# Patient Record
Sex: Male | Born: 1965 | Race: Black or African American | Hispanic: No | Smoking: Never smoker
Health system: Southern US, Community
[De-identification: ages and names within clinical notes are randomized; demographics above are authoritative.]

## PROBLEM LIST (undated history)

## (undated) DIAGNOSIS — I82409 Acute embolism and thrombosis of unspecified deep veins of unspecified lower extremity: Secondary | ICD-10-CM

---

## 1997-09-10 ENCOUNTER — Emergency Department (HOSPITAL_COMMUNITY): Admission: EM | Admit: 1997-09-10 | Discharge: 1997-09-10 | Payer: Self-pay | Admitting: Emergency Medicine

## 1999-12-14 ENCOUNTER — Encounter: Payer: Self-pay | Admitting: Orthopedic Surgery

## 1999-12-14 ENCOUNTER — Ambulatory Visit (HOSPITAL_COMMUNITY): Admission: RE | Admit: 1999-12-14 | Discharge: 1999-12-14 | Payer: Self-pay | Admitting: Orthopedic Surgery

## 2000-01-17 ENCOUNTER — Ambulatory Visit (HOSPITAL_COMMUNITY): Admission: RE | Admit: 2000-01-17 | Discharge: 2000-01-17 | Payer: Self-pay | Admitting: Orthopedic Surgery

## 2000-01-17 ENCOUNTER — Encounter: Payer: Self-pay | Admitting: Orthopedic Surgery

## 2000-01-31 ENCOUNTER — Encounter: Payer: Self-pay | Admitting: Orthopedic Surgery

## 2000-01-31 ENCOUNTER — Ambulatory Visit (HOSPITAL_COMMUNITY): Admission: RE | Admit: 2000-01-31 | Discharge: 2000-01-31 | Payer: Self-pay | Admitting: Orthopedic Surgery

## 2000-02-21 ENCOUNTER — Ambulatory Visit (HOSPITAL_COMMUNITY): Admission: RE | Admit: 2000-02-21 | Discharge: 2000-02-21 | Payer: Self-pay | Admitting: Orthopedic Surgery

## 2000-03-18 ENCOUNTER — Ambulatory Visit (HOSPITAL_COMMUNITY): Admission: RE | Admit: 2000-03-18 | Discharge: 2000-03-18 | Payer: Self-pay | Admitting: Orthopedic Surgery

## 2000-03-18 ENCOUNTER — Encounter: Payer: Self-pay | Admitting: Orthopedic Surgery

## 2003-04-28 ENCOUNTER — Emergency Department (HOSPITAL_COMMUNITY): Admission: EM | Admit: 2003-04-28 | Discharge: 2003-04-28 | Payer: Self-pay | Admitting: Emergency Medicine

## 2006-01-17 ENCOUNTER — Inpatient Hospital Stay (HOSPITAL_COMMUNITY): Admission: EM | Admit: 2006-01-17 | Discharge: 2006-01-19 | Payer: Self-pay | Admitting: Emergency Medicine

## 2006-01-21 ENCOUNTER — Ambulatory Visit (HOSPITAL_COMMUNITY): Admission: RE | Admit: 2006-01-21 | Discharge: 2006-01-21 | Payer: Self-pay | Admitting: *Deleted

## 2006-01-24 ENCOUNTER — Ambulatory Visit (HOSPITAL_COMMUNITY): Admission: RE | Admit: 2006-01-24 | Discharge: 2006-01-24 | Payer: Self-pay | Admitting: *Deleted

## 2006-01-24 ENCOUNTER — Encounter: Payer: Self-pay | Admitting: Vascular Surgery

## 2006-02-10 ENCOUNTER — Emergency Department (HOSPITAL_COMMUNITY): Admission: EM | Admit: 2006-02-10 | Discharge: 2006-02-10 | Payer: Self-pay | Admitting: Emergency Medicine

## 2007-03-26 IMAGING — CR DG CHEST 2V
2 series · 2 of 2 positions shown · non-contrast
Comparison: CT chest and chest x-ray 01/17/06.

CLINICAL DATA: Chest pain and pressure. 
 CHEST - 2 VIEW:

[w chest pa]
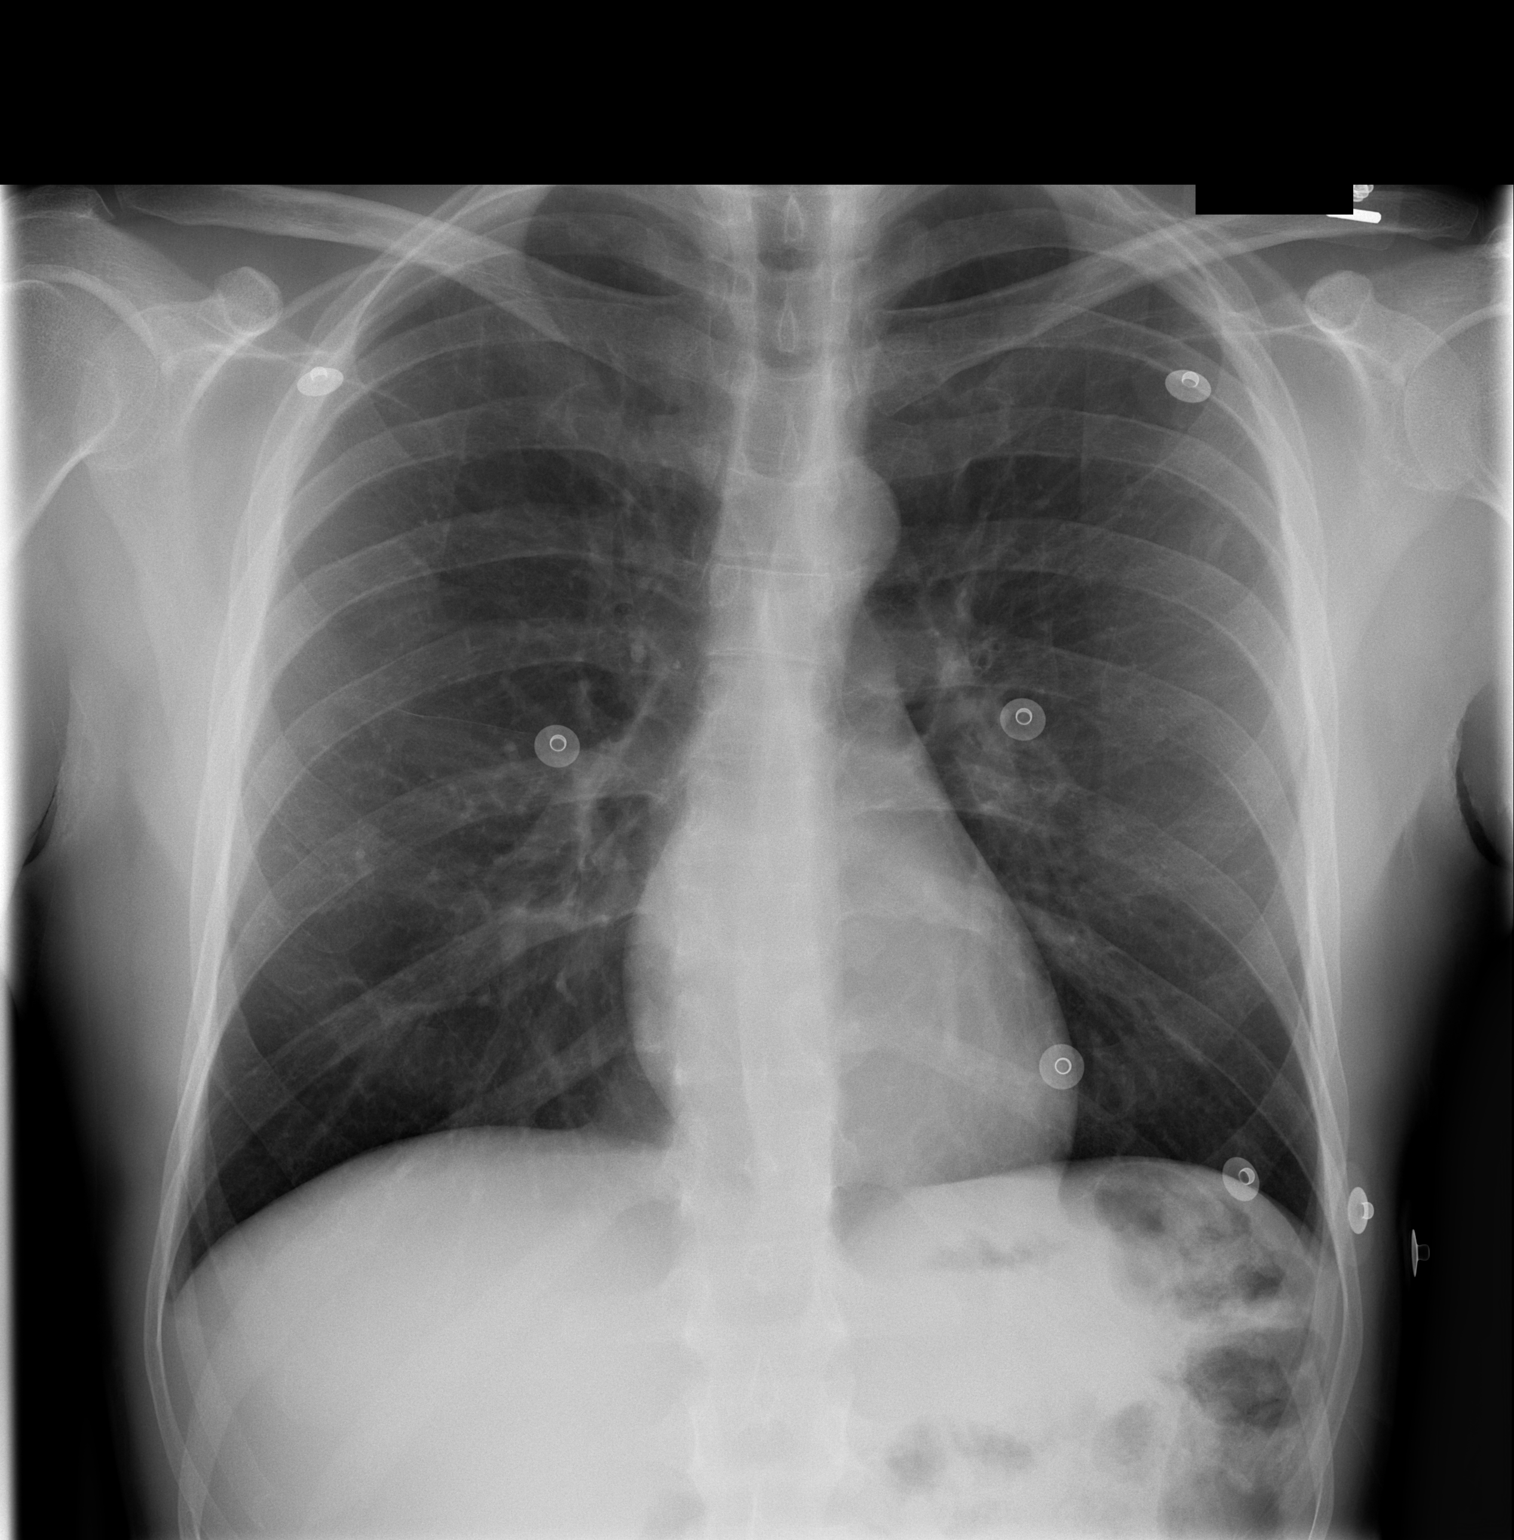

[w chest lat]
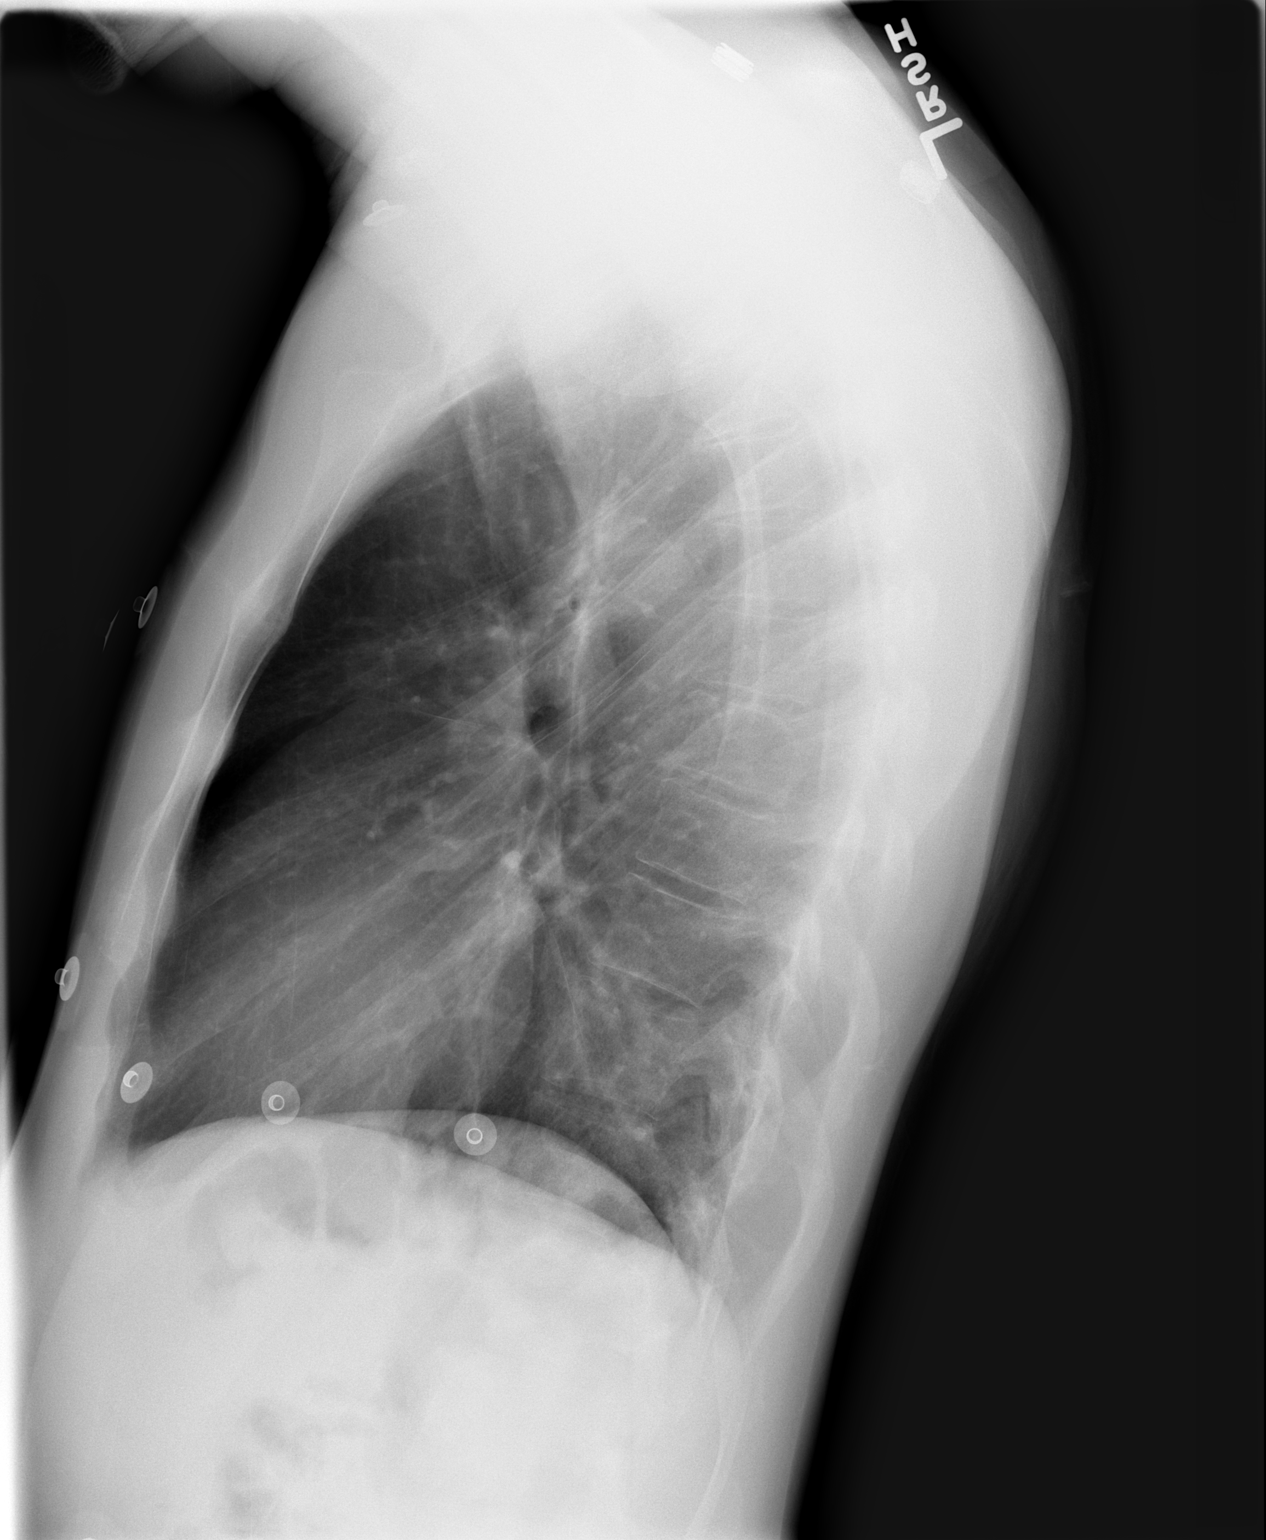

[2 of 2 positions shown; findings below may reference images not displayed]

FINDINGS: Trachea is midline.  Heart size normal.  Lungs are clear.  No pleural fluid.
IMPRESSION: No acute findings.

## 2007-08-31 ENCOUNTER — Emergency Department (HOSPITAL_COMMUNITY): Admission: EM | Admit: 2007-08-31 | Discharge: 2007-08-31 | Payer: Self-pay | Admitting: Emergency Medicine

## 2007-09-25 ENCOUNTER — Emergency Department: Payer: Self-pay | Admitting: Emergency Medicine

## 2009-05-19 ENCOUNTER — Emergency Department (HOSPITAL_COMMUNITY): Admission: EM | Admit: 2009-05-19 | Discharge: 2009-05-20 | Payer: Self-pay | Admitting: Emergency Medicine

## 2009-05-23 ENCOUNTER — Emergency Department (HOSPITAL_COMMUNITY): Admission: EM | Admit: 2009-05-23 | Discharge: 2009-05-23 | Payer: Self-pay | Admitting: Emergency Medicine

## 2009-05-25 ENCOUNTER — Emergency Department (HOSPITAL_COMMUNITY): Admission: EM | Admit: 2009-05-25 | Discharge: 2009-05-25 | Payer: Self-pay | Admitting: Emergency Medicine

## 2010-09-07 NOTE — H&P (Signed)
NAMESHAHEIM, MAHAR               ACCOUNT NO.:  [id number]   MEDICAL RECORD NO.:  [id number]          PATIENT TYPE:  EMS   LOCATION:  ED                           FACILITY:  [facility]   PHYSICIAN:  Melissa L. Ladona Ridgel, MD  DATE OF BIRTH:  11-May-1965   DATE OF ADMISSION:  01/17/2006  DATE OF DISCHARGE:                                HISTORY & PHYSICAL   CHIEF COMPLAINT:  Chest pain with syncope.   Primary care physician is Dr. Catha Gosselin.   HISTORY OF PRESENT ILLNESS:  The patient is a 45 year old African-American  male with no significant past medical history who states that he started to  have on and off chest pain starting approximately 1 month ago.  The patient  states that the pain came and went without any specific pattern and no  inciting factor could be identified.  He had the pain at rest, with exertion  and it did wake him up from sleep.  The patient noted, over the last month,  that the pain increased in frequency and intensity.  This morning the  patient awoke at 4 a.m. with chest pain and while talking to his girlfriend  was noted to have a brief syncopal episode.  The patient would not go to the  emergency room even at his fiancee's urging and decided to go to work.  This  afternoon the pain returned while he was sitting at his desk and he elected  to come to the emergency room because it was unrelenting.  He drove towards  the hospital but noted that the chest pain was worsening and, therefore, he  pulled off to the side of the road.  He had called his girlfriend who was  going to meet him at the emergency room and when she arrived he was not here  so she back-tracked to find him sitting along [address].  He appeared to  be unresponsive but awakened and was confused about where he was.  The  patient was, therefore, brought to the emergency room for further  evaluation.  In the emergency room, because of the syncope, the patient was  given a fluid bolus and further  evaluation ensued.  The patient's blood  pressure was never measured as being low but because of the syncope was  given a bolus.  In the ED, the patient was found to have no noted acute  changes in his EKG.  He was tachycardic on arrival and a subsequent EKG was  obtained when he was less tachycardic, which did not show any acute ST T-  wave changes, although the baseline was tremulous.  His first set of cardiac  markers was noted to be negative.  Because of the syncopal episode and the  chest discomfort, he was sent for a CAT scan of the chest, which revealed no  evidence for pulmonary embolus and no evidence for anything going on in the  chest wall that would suggest this pain.  He does have a liver hemangioma,  which can be evaluated at a later time.  The patient will be admitted to  step-down for further evaluation and a cardiology consult has been obtained  for this evening.   REVIEW OF SYSTEMS:  He did have some chills on arriving in the emergency  room after the IV was started but describes no other fever, weight loss.  He  did have 1 episode of diarrhea this week and 1 episode of vomiting that was  associated with the chest pain but nothing since.  The chest pain is  described as 10/10.  It is located, sometimes, below the nipple, sometimes  above the nipple.  It does radiate into the axilla and down the arm.  He  does note nausea with that but no vomiting in general.  It is relieved by  nothing in particular and exacerbated by nothing in particular.  The  patient, at the advice of his fiancee, started taking aspirin this week.  The patient did complain of some burning in the left leg and tenderness in  the lateral side of the leg.   PAST MEDICAL HISTORY:  None.   PAST SURGICAL HISTORY:  None.   SOCIAL HISTORY:  Denies tobacco, ethanol or illicit drug use.  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   FAMILY HISTORY:  Mom and Dad are both  living.  Mom is living with  hypertension.  Dad is living with no appreciable medical illness.  His  grandfather did have a history of blood clots on that side but no first  degree relatives have any heart disease.   ALLERGIES:  No known drug allergies.   MEDICATIONS:  Aspirin 81 mg when he has pain.  He has been taking it more  than once.   PHYSICAL EXAMINATION:  VITAL SIGNS:  Temperature:  97.3.  Blood pressure:  131/78.  Pulse:  90.  Respirations:  20.  Saturation 98%.  GENERAL:  A thin but well nourished African-American male in mild distress  secondary to chest pain.  HEENT:  Normocephalic, atraumatic.  Pupils equal, round and reactive to  light.  Extraocular muscles intact.  Mucous membranes are moist.  NECK:  Supple.  No JVD, no lymph nodes and no carotid bruits.  CHEST:  Clear to auscultation with no rhonchi, rales or wheezes.  BREAST:  Breast exam was undertaken.  The patient does have an area, which  feels thickened in the left tail area of the left breast.  He does not have  any palpable lymph nodes and there is no apparent skin changes over the area  but there definitely is an increased density in that area that is tender on  palpation.  CARDIOVASCULAR:  Regular rate and rhythm.  Positive S1, S2.  No S3, S4.  No  murmurs, rubs or gallops are noted.  ABDOMEN:  Soft, nontender, nondistended with positive bowel sounds.  EXTREMITIES:  No clubbing, cyanosis or edema.  His calves are bilaterally  nontender and soft.  He is able to identify the area that was bothering him  but at this point it is not appreciably tender.  NEUROLOGIC:  He is awake, alert and oriented.  Cranial nerves II-XII are  intact.  Power is 5/5.  DTRs 2+.  Plantars are down-going.   LABS:  Sodium 139, potassium 4, chloride 105, CO2 29, BUN 10, creatinine  1.1, glucose 96, d-dimer 0.22. Point of care  cardiac enzymes are negative x1.  His white count is 6, hemoglobin 14.5, hematocrit 41.7, platelets 308.   Chest x-ray shows no acute  pulmonary disease.  CT scan shows no PE, and, as  stated, the liver hemangioma is present and there does not appear to be any  chest wall area that is of significance showing hematoma or anything along  those lines.   ASSESSMENT/PLAN:  A 45 year old African-American male with chest pain and  syncope x2.  Pain is concerning for unstable angina.  The patient also has  an area of left chest wall pain that is palpable and does not appear to  coordinate with any findings of infiltrative disease, hematoma or glandular  tissue on the CT scan.  1. Cardiovascular disease, atypical chest pain concerning for unstable      angina but there is an element of unexplained left chest palpable pain      involving the area of the pectoralis on the left.  I will request a      repeat EKG.  After checking a CT head to make sure there is no      intracranial process for his syncope, will start him on heparin with a      bolus for ACS.  I will add Lopressor low-dose and consider a nitro      drip,  morphine for pain control.  2. Pulmonary CT scan is negative for pulmonary embolus.  He has no      pneumonia.  I reviewed at length the CT scan for any interior chest      wall abnormalities that may explain the patient's pain.  He does not      appear to have any increased glandularity, no hematoma and no specific      infiltrate or processes.  3. GI.  He has mild nausea.  We will provide him with Zofran and I will      put him on a proton pump inhibitor.  4. GU.  Will check a urine drug screen.  5. Endocrine.  Will check a TSH and CBGs  p.r.n. since he is complaining      of dizziness and has not eaten.      Appears to be slightly hypoglycemic.  6. Deep vein thrombosis prophylaxis.  This will be with heparin and we      will check Dopplers of the lower extremities to rule out deep vein      thrombosis.      Melissa L. Ladona Ridgel, MD  Electronically Signed     MLT/MEDQ  D:   01/17/2006  T:  01/18/2006  Job:  [id number]   cc:   Caryn Bee L. Little, M.D.  Fax: [phone]

## 2010-09-07 NOTE — Cardiovascular Report (Signed)
NAMEBRAXXTON, STOUDT NO.:  [id number]   MEDICAL RECORD NO.:  [id number]          PATIENT TYPE:  OIB   LOCATION:  2899                         FACILITY:  [facility]   PHYSICIAN:  Elmore Guise., M.D.DATE OF BIRTH:  01/05/1966   DATE OF PROCEDURE:  01/21/2006  DATE OF DISCHARGE:                              CARDIAC CATHETERIZATION   INDICATIONS FOR PROCEDURE:  Chest pain and syncope.   DESCRIPTION OF PROCEDURE:  The patient brought to the cardiac cath lab after  appropriate informed consent.  He was prepped and draped in sterile fashion.  Approximately 15 mL of 1% lidocaine was used for local anesthesia.  A 6-  French sheath was placed in the right femoral artery without difficulty.  Coronary angiography, LV angiography, limited right femoral angiography were  then performed.  Angio-Seal closure device was deployed without difficulty.  The patient was transferred from the cardiac cath lab in stable condition.   FINDINGS:  1. Left Main:  Normal.  2. LAD:  Normal-appearing.  3. D1:  High branching. normal-appearing.  4. LCX:  Nondominant, normal-appearing.  5. OM-1:  Small to moderate size, normal-appearing.  6. OM-2:  Moderate to large size, normal-appearing.  7. OM-3:  Small, normal-appearing.  8. RCA:  Dominant, normal-appearing.  9. PDA and PLV:  Normal-appearing.  10.LV:  EF is 55%.  No wall motion abnormalities.  LVEDP is 4 mmHg.  11.Limited right femoral angiogram showed no significant disease.      Appropriate arteriotomy site for Angio-Seal closure.  Angio-Seal was      deployed without difficulty.   IMPRESSION:  1. Normal-appearing coronary arteries.  2. Normal LV systolic function with EF of 55% and LVEDP of 4 mmHg.   PLAN:  At this time I would recommend continued low-dose aspirin and risk  factor modification as indicated.  The patient will be set up for outpatient  Holter to evaluate for arrhythmic cause of his syncope.      Elmore Guise., M.D.  Electronically Signed     TWK/MEDQ  D:  01/21/2006  T:  01/22/2006  Job:  [id number]

## 2010-09-07 NOTE — Discharge Summary (Signed)
Larry Hampton, Larry Hampton               ACCOUNT NO.:  [id number]   MEDICAL RECORD NO.:  [id number]          PATIENT TYPE:  INP   LOCATION:  1428                         FACILITY:  [facility]   PHYSICIAN:  Melissa L. Ladona Ridgel, MD  DATE OF BIRTH:  [birth year]   DATE OF ADMISSION:  01/17/2006  DATE OF DISCHARGE:  01/19/2006                                 DISCHARGE SUMMARY   CHIEF COMPLAINT ON ADMISSION:  Left chest pain and syncope.   DISCHARGE DIAGNOSES:  1. Atypical chest pain, both reproducible and nonreproducible, with two      syncopal episodes.  The patient presented to the emergency room after      being found in his car by his fiancee.  He had apparently developed an      altered level of consciousness possibly consistent with syncope while      driving himself to the hospital for left chest pain.  The patient has      been having on and off chest pain chest pain for the last month, and      earlier on the day of admission the patient did have a noted brief      syncopal episode witnessed by his girlfriend/fiancee.  The patient      elected not to seek care initially that day, but later in the afternoon      developed chest pain while at work and attempted to drive himself to      the emergency room.  When he did not arrive at the emergency      department, she back tracked and found him sitting in his car along      [facility].  The patient was seen and evaluated in the emergency      room.  He had no acute ST T-wave changes on his electrocardiogram, he      was started on heparin and prepared for admission.  He was admitted to      telemetry floor and evaluated by Cardiology on the night of admission.      The patient's cardiac markers have been completely negative, his      electrocardiograms have shown no significant acute changes.  He,      therefore, will be set up as an outpatient for cardiac catheterization      through Dr. Silva Bandy office.  2. Reproducible left chest pain:   The patient had an area of firm      induration in his left pectoralis area.  He denied any injury or      trauma.  He does not weight lift.  The area was exquisitely tender.  It      did not have any rash or any appearance that was suggestive of a zoster      infection.  Chest CT was reviewed in depth with the radiologist and no      appreciable injury, hematoma, or infection were noted.  To be further      sure of no infection or a tear, an MRI was completed, which showed no  obvious deformities or evidence for breast cancer.  The etiology of      this discomfort is unclear and, should it persist further, repeat      evaluation would be recommended as an outpatient.  3. Left calf pain x1 day:  The patient's D-dimer was within normal limits,      and therefore I feel that there is low probability that the patient has      a blood clot.  However, because of his syncopal episode and that he has      calf pain, I will recommend outpatient ultrasound testing to rule out a      possible deep venous thrombosis.  The patient will not discharge home      on any blood thinners as at this time the probability for clot is      minimal.   DISCHARGE MEDICATIONS:  1. Aspirin 325 mg once daily.  2. Percocet 5/325 1 to 2 tablets every 6 hours p.r.n. for pain.  3. Beta blockers are not being recommended at the time of discharge      because of the patient's slow heart rate while on beta blockade.   HOSPITAL COURSE:  The patient is a 45 year old, African-American male with a  significant past medical history, who states that over the last one month he  had been having on and off chest pain that would come even at rest.  The  pain was coming more frequently and more intense over the past couple of  weeks, and on the day of admission the patient awoke at 4 a.m. with the pain  in his chest.  He was talking with his fiancee about the pain when he had  what appeared to be a syncopal episode for several  seconds.  He refused to  come in for treatment and later in the day developed another episode pain.  This time, it was significantly worse.  The pain was intense enough that he  felt that he should come to the emergency room, and therefore he called his  fiancee and told her he was driving to the emergency room.  On the way to  the emergency room, the pain became very intense, and he pulled over to the  side of the road and does not recall what happened.  When his fiancee did  not find him at the emergency room, she came back across the course, which  he would have driven and found him sitting alongside the road in a less than  awake status.  The patient's girlfriend states that he was confused, and she  brought him to the emergency room for further evaluation.  In the emergency  room, his EKG showed no acute ST T-wave changes, his first set of cardiac  markers were negative, [facility] asked to admit for further workup.  The patient was admitted to a telemetry floor and evaluated by Dr. Reyes Ivan  from Cardiology.  It was determined that a cardiac catheterization  would be  the best course of treatment, and the patient was ruled out with serial  cardiac markers and treated with heparin, aspirin, and beta blocker.  Over  the course of 48 hours, the patient's cardiac markers have remained  negative, his chest pain has been less and better controlled with morphine,  his left leg no longer was painful whereas on admission he had complained of  pain in his left leg.  Because he had no evidence for dysrhythmia and no  elevation in his cardiac markers, it was determined that an outpatient  cardiac catheterization would be appropriate.  Dr. Reyes Ivan will arrange this  for Tuesday.   Because the patient did have leg pain associated with symptomatology, I will  go ahead and do an ultrasound as an outpatient.  At this time, I am not concerned enough to place the patient on heparin but would ask  that his  primary care physician please follow up on the ultrasound reports to assure  that there is no underlying occult DVT.   Please note the patient underwent CT scanning of the chest, which showed no  obvious pulmonary embolism.  He also had no evidence for any infiltrative  process in the left side of his chest where the pain was on palpation.  Because the pain persisted and was superficial involving the left tail of  the breast and the pectoralis muscle, the patient underwent MRI studies,  which showed no obvious infiltrative disease or inflammation or infection.   PHYSICAL EXAMINATION AT DISCHARGE:  VITAL SIGNS:  The patient's vital signs  have been stable.  HEENT:  Normocephalic, atraumatic.  Pupils equal, round, and reactive to  light, extraocular muscles are intact, mucous membranes are moist.  NECK:  Supple.  There is no JVD, no lymph nodes, and no carotid bruits.  CHEST:  Clear to auscultation.  There is no rhonchi, rales, or wheezes.  He  does remain with a little bit of tenderness in his tendon of the left chest  wall pectoralis muscle.  ABDOMEN:  Soft, nontender, and nondistended with positive bowel sounds.  EXTREMITIES:  No clubbing, cyanosis, or edema.  NEUROLOGIC:  He is awake, alert, and oriented.  Cranial nerves II-XII are  intact.  Power 5/5.   PERTINENT LABORATORY VALUES DURING COURSE OF HOSPITAL STAY:  White count of  5.8, hemoglobin 12.8, hematocrit 38.3, and platelets 258.  His total  cholesterol is 143, triglycerides 41, HDL cholesterol is 50, LDL cholesterol  is 85.  He has a TSH, which was within normal limits at 3.49, and his D-  dimer was 0.22.  His urine drug screen showed no detectible illicit drugs.  His liver function studies were within normal limits.   At this time, the patient is stable for discharge and cleared by Cardiology  for outpatient cardiac catheterization.  He will not return to work until  released by Cardiology.  I will ask him not to  drive until released by  Cardiology, and he has been instructed not to have any strenuous activity.  Dr. Silva Bandy office will arrange for outpatient catheterization.   The patient has been instructed to call if he has any questions at Dr.  Silva Bandy office, [phone].   At this time, the patient is deemed stable for discharge to follow up as an  outpatient.  The patient will report to the [facility] for a  Doppler ultrasound of the lower extremities with a report to Dr. Clarene Duke to  evaluate for DVT.  I will provide a prescription as such.      Melissa L. Ladona Ridgel, MD  Electronically Signed     MLT/MEDQ  D:  01/19/2006  T:  01/19/2006  Job:  [id number]   cc:   Elmore Guise., M.D.  Fax: [phone]   Anna Genre. Little, M.D.  Fax: [phone]

## 2011-02-14 ENCOUNTER — Emergency Department (HOSPITAL_COMMUNITY): Payer: Self-pay

## 2011-02-14 ENCOUNTER — Inpatient Hospital Stay (HOSPITAL_COMMUNITY)
Admission: EM | Admit: 2011-02-14 | Discharge: 2011-02-15 | DRG: 287 | Disposition: A | Discharge status: Home / Self Care | Payer: Self-pay | Attending: Cardiology | Admitting: Cardiology

## 2011-02-14 DIAGNOSIS — R0789 Other chest pain: Principal | ICD-10-CM | POA: Diagnosis present

## 2011-02-14 DIAGNOSIS — Z86711 Personal history of pulmonary embolism: Secondary | ICD-10-CM

## 2011-02-14 LAB — POCT I-STAT TROPONIN I: Troponin i, poc: 0 ng/mL (ref 0.00–0.08)

## 2011-02-14 LAB — POCT I-STAT, CHEM 8
Calcium, Ion: 1.23 mmol/L (ref 1.12–1.32)
Glucose, Bld: 79 mg/dL (ref 70–99)
HCT: 47 % (ref 39.0–52.0)
Hemoglobin: 16 g/dL (ref 13.0–17.0)
Potassium: 3.7 mEq/L (ref 3.5–5.1)

## 2011-02-14 LAB — CBC
HCT: 42.8 % (ref 39.0–52.0)
Hemoglobin: 14.7 g/dL (ref 13.0–17.0)
MCH: 29.1 pg (ref 26.0–34.0)
MCHC: 34.3 g/dL (ref 30.0–36.0)

## 2011-02-14 LAB — PROTIME-INR: Prothrombin Time: 14 seconds (ref 11.6–15.2)

## 2011-02-14 MED ORDER — IOHEXOL 300 MG/ML  SOLN: 100.0000 mL | Freq: Once | INTRAMUSCULAR | Status: AC | PRN

## 2011-02-15 DIAGNOSIS — R079 Chest pain, unspecified: Secondary | ICD-10-CM

## 2011-02-15 LAB — URINALYSIS, ROUTINE W REFLEX MICROSCOPIC
Glucose, UA: NEGATIVE mg/dL
Leukocytes, UA: NEGATIVE
pH: 6.5 (ref 5.0–8.0)

## 2011-02-16 LAB — URINE CULTURE
Colony Count: NO GROWTH
Culture  Setup Time: 201210261450

## 2011-02-18 ENCOUNTER — Emergency Department (HOSPITAL_COMMUNITY): Payer: Self-pay

## 2011-02-18 ENCOUNTER — Emergency Department (HOSPITAL_COMMUNITY)
Admission: EM | Admit: 2011-02-18 | Discharge: 2011-02-18 | Disposition: A | Discharge status: Home / Self Care | Payer: Self-pay | Attending: Emergency Medicine | Admitting: Emergency Medicine

## 2011-02-18 DIAGNOSIS — R112 Nausea with vomiting, unspecified: Secondary | ICD-10-CM | POA: Insufficient documentation

## 2011-02-18 DIAGNOSIS — Z86711 Personal history of pulmonary embolism: Secondary | ICD-10-CM | POA: Insufficient documentation

## 2011-02-18 DIAGNOSIS — Z86718 Personal history of other venous thrombosis and embolism: Secondary | ICD-10-CM | POA: Insufficient documentation

## 2011-02-18 DIAGNOSIS — M542 Cervicalgia: Secondary | ICD-10-CM | POA: Insufficient documentation

## 2011-02-18 DIAGNOSIS — R110 Nausea: Secondary | ICD-10-CM | POA: Insufficient documentation

## 2011-02-18 DIAGNOSIS — R5381 Other malaise: Secondary | ICD-10-CM | POA: Insufficient documentation

## 2011-02-18 DIAGNOSIS — R071 Chest pain on breathing: Secondary | ICD-10-CM | POA: Insufficient documentation

## 2011-02-18 LAB — POCT I-STAT, CHEM 8
Creatinine, Ser: 1.4 mg/dL — ABNORMAL HIGH (ref 0.50–1.35)
Glucose, Bld: 86 mg/dL (ref 70–99)
Hemoglobin: 16.7 g/dL (ref 13.0–17.0)
TCO2: 30 mmol/L (ref 0–100)

## 2011-02-18 LAB — HEPATIC FUNCTION PANEL
Albumin: 4.5 g/dL (ref 3.5–5.2)
Alkaline Phosphatase: 64 U/L (ref 39–117)
Total Bilirubin: 0.4 mg/dL (ref 0.3–1.2)

## 2011-02-18 LAB — CBC
HCT: 46 % (ref 39.0–52.0)
MCHC: 34.1 g/dL (ref 30.0–36.0)
RDW: 13.1 % (ref 11.5–15.5)

## 2011-02-18 LAB — LIPASE, BLOOD: Lipase: 33 U/L (ref 11–59)

## 2011-02-18 LAB — DIFFERENTIAL
Basophils Absolute: 0.1 10*3/uL (ref 0.0–0.1)
Basophils Relative: 1 % (ref 0–1)
Eosinophils Relative: 3 % (ref 0–5)
Lymphocytes Relative: 31 % (ref 12–46)
Monocytes Absolute: 0.7 10*3/uL (ref 0.1–1.0)

## 2011-02-18 LAB — POCT I-STAT TROPONIN I

## 2011-02-18 NOTE — Consult Note (Signed)
NAMEARTIE, Larry Hampton NO.:  [id number]  MEDICAL RECORD NO.:  [id number]  LOCATION:  2503                         FACILITY:  [facility]  PHYSICIAN:  Noralyn Pick. Eden Emms, MD, FACCDATE OF BIRTH:  01/14/1966  DATE OF CONSULTATION: DATE OF DISCHARGE:  02/15/2011                                CONSULTATION   A 45 year old patient, we were asked to see in the CDU for chest pain, cardiac CT, and stress echo test.  The patient has previously been seen by Dr. Reyes Ivan in 2007.  He had a normal heart cath.  Subsequently, I believe in 2009, there was a question of having a pulmonary emboli. Review of CT scans in 2007 and 2012 did not show this, but the patient says he was treated with Lovenox shots.  He was not on Coumadin for a prolonged period of time.  The patient awoke with chest pain this morning.  There was some relief with nitro.  He got groggy with morphine.  He is pain-free now.  His lab work was remarkable for negative troponin.  The patient had a stress echo, which was read by Dr. Myrtis Ser as suspicious for anterior wall ischemia.  I talked to the patient.  Given his positive stress echo and chest pain, he needs to have a followup heart cath.  We will try to get this done today.  The risks including stroke, bleeding, and need for emergency surgery and dye allergy were discussed. He is willing to proceed.  REVIEW OF SYSTEMS:  Ten-point review of systems is otherwise negative. In particular, he has not had signs of recurrent PE.  No lower extremity edema, and the CT scan in the ER was negative for pulmonary emboli.  MEDICATIONS:  He takes no medications.  ALLERGIES:  He denies any allergies.  FAMILY HISTORY:  Negative for premature coronary disease and negative for hypercoagulability.  The patient is a Scientist, research (medical).  He is divorced.  He has 5 children.  He does not smoke or drink on a regular basis.  He has otherwise been healthy.  PHYSICAL EXAMINATION:   GENERAL:  Elderly-appearing black male, in no distress. VITAL SIGNS:  His blood pressure is 140/64, pulse is 80 and regular, respiratory rate 14, afebrile. HEENT:  Unremarkable. NECK:  Supple.  No lymphadenopathy.  No thyromegaly. LUNGS:  Clear.  Good diaphragmatic motion.  No wheezing. HEART:  S1 and S2.  Normal heart sounds.  PMI normal. ABDOMEN:  Benign.  Bowel sounds positive.  No AAA.  No tenderness.  No bruit.  No hepatosplenomegaly or hepatojugular reflux or tenderness. EXTREMITIES:  Distal pulses are intact with no edema. NEURO:  Nonfocal. SKIN:  Warm and dry.  No muscular weakness.  Baseline EKG is normal.  Point-of-care enzymes are negative.  CBC shows hematocrit of 42.8, platelet count of 282.  IMPRESSION: 1. Chest pain with positive stress echocardiogram.  Diagnostic cath to     be done today.  Risks discussed.  The patient is willing to     proceed.  This procedure likely to be done by Dr. Shirlee Latch. 2. History of pulmonary emboli.  No lower extremity edema.  Negative     CT for  recurrence.  This would not be an explanation for his    symptoms.     Noralyn Pick. Eden Emms, MD, [facility]     PCN/MEDQ  D:  02/15/2011  T:  02/15/2011  Job:  [id number]  Electronically Signed by Charlton Haws MD [facility] on 02/18/2011 09:07:20 AM

## 2011-02-23 NOTE — Cardiovascular Report (Signed)
  Larry Hampton, VANWAGONER NO.:  [id number]  MEDICAL RECORD NO.:  [id number]  LOCATION:  2503                         FACILITY:  [facility]  PHYSICIAN:  Marca Ancona, MD      DATE OF BIRTH:  1965/05/22  DATE OF PROCEDURE: DATE OF DISCHARGE:  02/15/2011                           CARDIAC CATHETERIZATION   PROCEDURES: 1. Left heart catheterization. 2. Coronary angiography. 3. Left ventriculography.  INDICATION:  [name]  He had a stress test that was abnormal and therefore he was scheduled for left heart catheterization.  PROCEDURE NOTE:  After informed consent was obtained, the patient underwent Allen testing on his right wrist.  The right ulnar artery gave good collateral circulation to the radial side of the hand constituting a positive Allen test.  The right radial area was sterilely prepped and draped.  Lidocaine 1% was used to locally anesthetize the right radial area.  The right radial artery was engaged using modified Seldinger technique, and a 5-French arterial sheath was placed.  The patient then received 3 mg of intra-arterial verapamil and 3500 units of IV heparin. The right coronary artery was engaged using the JR4 catheter.  The left coronary artery was engaged using the JL 3.5 catheter.  Left ventricle was entered using angled pigtail catheter.  There were no complications.  FINDINGS: 1. Hemodynamics.  Aorta 101/63, LV 101/3. 2. Left ventriculography.  EF was estimated at 55% with no regional     wall motion abnormalities in the RAO projection. 3. Coronary angiography.  There was no angiographic coronary disease.     The patient had a right dominant system.  IMPRESSION:  The patient's chest pain is likely noncardiac.  He may be discharged home today after appropriate bedrest.     Marca Ancona, MD     DM/MEDQ  D:  02/15/2011  T:  02/16/2011  Job:  [id number]  Electronically Signed  by Marca Ancona MD on 02/23/2011 11:38:19 PM

## 2011-02-23 NOTE — Discharge Summary (Signed)
  NAMEVERNON, Larry Hampton NO.:  [id number]  MEDICAL RECORD NO.:  [id number]  LOCATION:  2503                         FACILITY:  [facility]  PHYSICIAN:  Noralyn Pick. Eden Emms, MD, FACCDATE OF BIRTH:  1966-04-05  DATE OF ADMISSION:  02/14/2011 DATE OF DISCHARGE:  02/15/2011                              DISCHARGE SUMMARY   PRIMARY CARDIOLOGIST:  Theron Arista C. Eden Emms, MD, [facility]  He does not have a primary care doctor.  DISCHARGE DIAGNOSES: 1. Chest pain with normal coronary artery on catheterizations. 2. Prior history of pulmonary embolus per report.  ALLERGIES:  No known drug allergies.  PROCEDURES: 1. Stress echocardiogram where the patient walked for a total of 5     minutes achieving a maximal heart rate of 160 beats per minute.     The test was discontinued secondary to fatigue and dyspnea.  Stress     echo imaging was suggestive of ischemia. 2. Left heart cardiac catheterization performed October 26, 2012showing normal coronary arteries and normal LV function with an EF     of 55%.  HISTORY OF PRESENT ILLNESS:  A 45 year old male with the above problem list presented to [facility] ED on October 25 with complaints of pleuritic and atypical chest discomfort.  His cardiac enzymes were negative and he was sent for a stress echocardiogram, which suggested ischemia with normal LV function.  He was subsequently admitted for further evaluation.  HOSPITAL COURSE:  In the setting of abnormal stress echo, diagnostic catheterization was carried out revealing normal coronary arteries and normal LV function.  It is felt the patient could be discharged home safely this evening in good condition.  DISCHARGE LABS:  Hemoglobin 14.7, hematocrit 42.8, WBC 8.4, platelets 282,000, INR 1.06.  Sodium 139, potassium 3.7, chloride 103, BUN 9, creatinine 1.2, glucose 79.  Troponin I negative x3.  Urinalysis was negative.  DISPOSITION:  The patient will be discharged home today in  good condition.  FOLLOWUP PLANS/APPOINTMENTS:  I advised the patient to obtain followup with primary care provider.  DISCHARGE MEDICATIONS:  None.  OUTSTANDING LABS AND STUDIES:  None.  DURATION OF DISCHARGE ENCOUNTER:  45 minutes including physician time.     [name], ANP   ______________________________ Noralyn Pick. Eden Emms, MD, [facility]    CB/MEDQ  D:  02/15/2011  T:  02/16/2011  Job:  [id number]  Electronically Signed by [name] ANP on 02/23/2011 01:56:21 PM Electronically Signed by Charlton Haws MD [facility] on 02/23/2011 08:52:00 PM

## 2011-02-26 ENCOUNTER — Other Ambulatory Visit: Payer: Self-pay

## 2011-02-26 ENCOUNTER — Emergency Department (HOSPITAL_COMMUNITY): Payer: Self-pay

## 2011-02-26 ENCOUNTER — Encounter: Payer: Self-pay | Admitting: Emergency Medicine

## 2011-02-26 ENCOUNTER — Emergency Department (HOSPITAL_COMMUNITY)
Admission: EM | Admit: 2011-02-26 | Discharge: 2011-02-26 | Disposition: A | Discharge status: Home / Self Care | Payer: Self-pay | Attending: Emergency Medicine | Admitting: Emergency Medicine

## 2011-02-26 DIAGNOSIS — R079 Chest pain, unspecified: Secondary | ICD-10-CM | POA: Insufficient documentation

## 2011-02-26 DIAGNOSIS — R091 Pleurisy: Secondary | ICD-10-CM | POA: Insufficient documentation

## 2011-02-26 DIAGNOSIS — K259 Gastric ulcer, unspecified as acute or chronic, without hemorrhage or perforation: Secondary | ICD-10-CM | POA: Insufficient documentation

## 2011-02-26 DIAGNOSIS — R112 Nausea with vomiting, unspecified: Secondary | ICD-10-CM | POA: Insufficient documentation

## 2011-02-26 DIAGNOSIS — R0602 Shortness of breath: Secondary | ICD-10-CM | POA: Insufficient documentation

## 2011-02-26 DIAGNOSIS — R1013 Epigastric pain: Secondary | ICD-10-CM | POA: Insufficient documentation

## 2011-02-26 LAB — COMPREHENSIVE METABOLIC PANEL
ALT: 16 U/L (ref 0–53)
AST: 18 U/L (ref 0–37)
Albumin: 4.1 g/dL (ref 3.5–5.2)
CO2: 28 mEq/L (ref 19–32)
Chloride: 103 mEq/L (ref 96–112)
Creatinine, Ser: 0.99 mg/dL (ref 0.50–1.35)
GFR calc non Af Amer: >90 mL/min (ref >90)
Potassium: 4 mEq/L (ref 3.5–5.1)
Sodium: 141 mEq/L (ref 135–145)
Total Bilirubin: 0.5 mg/dL (ref 0.3–1.2)

## 2011-02-26 LAB — DIFFERENTIAL
Basophils Absolute: 0 10*3/uL (ref 0.0–0.1)
Basophils Relative: 1 % (ref 0–1)
Lymphocytes Relative: 23 % (ref 12–46)
Monocytes Absolute: 0.7 10*3/uL (ref 0.1–1.0)
Neutro Abs: 5.1 10*3/uL (ref 1.7–7.7)
Neutrophils Relative %: 66 % (ref 43–77)

## 2011-02-26 LAB — CBC
HCT: 43.9 % (ref 39.0–52.0)
MCHC: 34.4 g/dL (ref 30.0–36.0)
Platelets: 296 10*3/uL (ref 150–400)
RDW: 12.8 % (ref 11.5–15.5)
WBC: 7.7 10*3/uL (ref 4.0–10.5)

## 2011-02-26 MED ORDER — GI COCKTAIL ~~LOC~~
30.0000 mL | Freq: Once | ORAL | Status: AC
  Administered 2011-02-26: 30 mL via ORAL
  Filled 2011-02-26: qty 30

## 2011-02-26 MED ORDER — MORPHINE SULFATE 4 MG/ML IJ SOLN
4.0000 mg | Freq: Once | INTRAMUSCULAR | Status: AC
  Administered 2011-02-26: 4 mg via INTRAVENOUS
  Filled 2011-02-26: qty 1

## 2011-02-26 MED ORDER — ONDANSETRON HCL 4 MG/2ML IJ SOLN
4.0000 mg | Freq: Once | INTRAMUSCULAR | Status: AC
  Administered 2011-02-26: 4 mg via INTRAVENOUS
  Filled 2011-02-26: qty 2

## 2011-02-26 MED ORDER — ONDANSETRON HCL 4 MG/2ML IJ SOLN
4.0000 mg | Freq: Once | INTRAMUSCULAR | Status: AC
  Administered 2011-02-26: 4 mg via INTRAVENOUS

## 2011-02-26 MED ORDER — OMEPRAZOLE 20 MG PO CPDR: 40.0000 mg | DELAYED_RELEASE_CAPSULE | Freq: Every day | ORAL | Status: DC

## 2011-02-26 MED ORDER — RANITIDINE HCL 150 MG PO CAPS: 150.0000 mg | ORAL_CAPSULE | Freq: Every day | ORAL | Status: DC

## 2011-02-26 MED ORDER — HYDROCODONE-ACETAMINOPHEN 5-500 MG PO TABS: 1.0000 | ORAL_TABLET | Freq: Four times a day (QID) | ORAL | Status: AC | PRN

## 2011-02-26 MED ORDER — ONDANSETRON HCL 4 MG/2ML IJ SOLN
INTRAMUSCULAR | Status: AC
  Filled 2011-02-26: qty 2

## 2011-02-26 NOTE — ED Provider Notes (Addendum)
History     CSN: [id number] Arrival date & time: 02/26/2011 12:22 PM   First MD Initiated Contact with Patient 02/26/11 1223      Chief Complaint  Patient presents with  . Pleurisy    (Consider location/radiation/quality/duration/timing/severity/associated sxs/prior treatment) Patient is a 45 y.o. male presenting with chest pain. The history is provided by the patient.  Chest Pain The chest pain began more than 2 weeks ago. Duration of episode(s) is 2 weeks. Chest pain occurs constantly. The chest pain is unchanged. The pain is associated with breathing. The severity of the pain is moderate. The quality of the pain is described as sharp. The pain does not radiate. Primary symptoms include shortness of breath, abdominal pain (epigastric burning sensation), nausea and vomiting. Pertinent negatives for primary symptoms include no fever, no cough, no wheezing, no dizziness and no altered mental status.  The abdominal pain began more than 2 days ago. The abdominal pain has been unchanged since its onset. The abdominal pain is located in the epigastric region. The abdominal pain does not radiate. The severity of the abdominal pain is 9/10. The abdominal pain is relieved by nothing.  He tried NSAIDs for the symptoms. Risk factors include no known risk factors.     Past Medical History  Diagnosis Date  . Pleurisy 02/22/2011    History reviewed. No pertinent past surgical history.  History reviewed. No pertinent family history.  History  Substance Use Topics  . Smoking status: Never Smoker   . Smokeless tobacco: Never Used  . Alcohol Use: No      Review of Systems  Constitutional: Negative for fever.  Respiratory: Positive for shortness of breath. Negative for cough and wheezing.   Cardiovascular: Positive for chest pain.  Gastrointestinal: Positive for nausea, vomiting and abdominal pain (epigastric burning sensation).  Neurological: Negative for dizziness.    Psychiatric/Behavioral: Negative for altered mental status.  All other systems reviewed and are negative.    Allergies  Review of patient's allergies indicates no known allergies.  Home Medications   Current Outpatient Rx  Name Route Sig Dispense Refill  . HYDROCODONE-ACETAMINOPHEN 5-325 MG PO TABS Oral Take 1 tablet by mouth every 6 (six) hours as needed.      . IBUPROFEN 800 MG PO TABS Oral Take 800 mg by mouth every 8 (eight) hours as needed.        SpO2 98%  Physical Exam  Nursing note and vitals reviewed. Constitutional: He is oriented to person, place, and time. He appears well-developed and well-nourished. No distress.  HENT:  Head: Normocephalic and atraumatic.  Eyes: Pupils are equal, round, and reactive to light.  Neck: Normal range of motion.  Cardiovascular: Normal rate, regular rhythm and normal heart sounds.   Pulmonary/Chest: Breath sounds normal. No respiratory distress. He exhibits tenderness (TTP of L chest where pt is hurting).  Abdominal: Soft. There is tenderness (TTP of epigastric area and LUQ.).  Musculoskeletal: Normal range of motion. He exhibits no tenderness.  Neurological: He is alert and oriented to person, place, and time. No cranial nerve deficit.  Skin: Skin is warm and dry.    ED Course  Procedures (including critical care time)   Labs Reviewed  CBC  DIFFERENTIAL  COMPREHENSIVE METABOLIC PANEL  LIPASE, BLOOD  POCT H PYLORI SCREEN  H. PYLORI ANTIBODY, IGG   Dg Abd Acute W/chest  02/26/2011  *RADIOLOGY REPORT*  Clinical Data: Abdomen and chest pain; constipation and loss of appetite with nausea and vomiting  ACUTE ABDOMEN  SERIES (ABDOMEN 2 VIEW & CHEST 1 VIEW)  Comparison: None.  Findings: The cardiac silhouette, mediastinum, pulmonary vasculature are within normal limits.  Both lungs are clear.  The stool and bowel gas pattern is within normal limits with no evidence of gross obstruction or pneumoperitoneum.  There is no gross  organomegaly.  No abnormal calcifications overlie the abdomen or pelvis. There is no acute bony abnormality.  IMPRESSION: Negative acute abdominal series.  Original Report Authenticated By: Brandon Melnick, M.D.     1. Chest pain   2. Gastric ulcer      Date: 02/26/2011  Rate: 98  Rhythm: normal sinus rhythm  QRS Axis: normal  Intervals: normal  ST/T Wave abnormalities: normal  Conduction Disutrbances:none  Narrative Interpretation:   Old EKG Reviewed: unchanged    MDM   Pt presented due to chest pain, left upper chest, epigastric burning sensation, vomiting with blood in it constant for several weeks.  He is well appearing on exam, TTP of epigastric area, TTP of L chest,  R/o by cards with a neg cath.  Doubt ACS, doubt PE given story.  Upon intial exam HR noted to be in 90s when I saw him.  Repeat HR 82 at d/c.  Feel this may be gastric ulcer.  Labs unremarkable.  Maalox improved symptoms but they then returned.  Given Morphine with improvement.  Will d/ chome on PPI and Zantac.  Told him to f/u with PCP tomorrow.        Nena Alexander, MD Resident 02/26/11 1456  Nena Alexander, MD Resident 02/26/11 [phone]

## 2011-02-26 NOTE — ED Notes (Signed)
Pt back from radiology.  Pt sts that pain 10/10 and no longer 15/10.

## 2011-02-26 NOTE — ED Notes (Addendum)
Pt here with c/o epigastric burning pain that he rates 7/10 as well as left sided chest heaviness, symptoms have been constant for about 2 weeks now.  Pt reports n/v as well that started on Monday.  Pt reports vomiting dark red blood as well.     Pt alert and oriented x4.

## 2011-02-26 NOTE — ED Provider Notes (Signed)
I saw and evaluated the patient, reviewed the resident's note and I agree with the findings and plan.   Nelia Shi, MD 02/26/11 [phone]

## 2011-02-26 NOTE — ED Notes (Signed)
Per EMS:  Pt here with c/o chest wall pain x 2 weeks, has been seen for the same on Friday and Monday.  At that time pt was dx with pleurisy and given scripts for po pain meds.  Pt reports left sided witgh radiation to left arm, pt reports sob and nausea as well.  Upon ems arrival pt was found post pseudo syncopal event.  Pt artes pain 10/10.

## 2011-02-26 NOTE — ED Provider Notes (Deleted)
I saw and evaluated the patient, reviewed the resident's note and I agree with the findings and plan.    Nelia Shi, MD 02/26/11 1500

## 2011-02-27 NOTE — ED Provider Notes (Signed)
Medical screening examination/treatment/procedure(s) were performed by non-physician practitioner and as supervising physician I was immediately available for consultation/collaboration.   Nelia Shi, MD 02/27/11 1139

## 2011-03-04 ENCOUNTER — Other Ambulatory Visit: Payer: Self-pay | Admitting: Gastroenterology

## 2011-03-07 ENCOUNTER — Other Ambulatory Visit: Payer: Self-pay | Admitting: Gastroenterology

## 2011-03-07 ENCOUNTER — Ambulatory Visit
Admission: RE | Admit: 2011-03-07 | Discharge: 2011-03-07 | Disposition: A | Discharge status: Home / Self Care | Payer: No Typology Code available for payment source | Source: Ambulatory Visit | Attending: Gastroenterology | Admitting: Gastroenterology

## 2011-03-07 DIAGNOSIS — K769 Liver disease, unspecified: Secondary | ICD-10-CM

## 2011-03-08 ENCOUNTER — Ambulatory Visit
Admission: RE | Admit: 2011-03-08 | Discharge: 2011-03-08 | Disposition: A | Discharge status: Home / Self Care | Payer: No Typology Code available for payment source | Source: Ambulatory Visit | Attending: Gastroenterology | Admitting: Gastroenterology

## 2011-03-08 DIAGNOSIS — K769 Liver disease, unspecified: Secondary | ICD-10-CM

## 2011-03-08 MED ORDER — IOHEXOL 300 MG/ML  SOLN
100.0000 mL | Freq: Once | INTRAMUSCULAR | Status: AC | PRN
  Administered 2011-03-08: 100 mL via INTRAVENOUS

## 2011-03-11 ENCOUNTER — Other Ambulatory Visit (HOSPITAL_COMMUNITY): Payer: Self-pay | Admitting: Gastroenterology

## 2011-03-18 ENCOUNTER — Encounter (HOSPITAL_COMMUNITY)
Admission: RE | Admit: 2011-03-18 | Discharge: 2011-03-18 | Disposition: A | Discharge status: Home / Self Care | Payer: Self-pay | Source: Ambulatory Visit | Attending: Gastroenterology | Admitting: Gastroenterology

## 2011-03-18 DIAGNOSIS — R112 Nausea with vomiting, unspecified: Secondary | ICD-10-CM | POA: Insufficient documentation

## 2011-03-18 DIAGNOSIS — K828 Other specified diseases of gallbladder: Secondary | ICD-10-CM | POA: Insufficient documentation

## 2011-03-18 DIAGNOSIS — R109 Unspecified abdominal pain: Secondary | ICD-10-CM | POA: Insufficient documentation

## 2011-03-18 MED ORDER — SINCALIDE 5 MCG IJ SOLR: 0.0200 ug/kg | Freq: Once | INTRAMUSCULAR | Status: DC

## 2011-03-18 MED ORDER — TECHNETIUM TC 99M MEBROFENIN IV KIT
5.5000 | PACK | Freq: Once | INTRAVENOUS | Status: AC | PRN
  Administered 2011-03-18: 6 via INTRAVENOUS

## 2011-04-04 ENCOUNTER — Encounter (INDEPENDENT_AMBULATORY_CARE_PROVIDER_SITE_OTHER): Payer: Self-pay | Admitting: General Surgery

## 2011-04-04 ENCOUNTER — Ambulatory Visit (INDEPENDENT_AMBULATORY_CARE_PROVIDER_SITE_OTHER): Payer: Self-pay | Admitting: General Surgery

## 2011-04-04 VITALS — BP 132/84 | HR 64 | Temp 97.3°F | Resp 18 | Ht 65.5 in | Wt 129.5 lb

## 2011-04-04 DIAGNOSIS — K828 Other specified diseases of gallbladder: Secondary | ICD-10-CM

## 2011-04-04 NOTE — Patient Instructions (Signed)
[facility] LAPAROSCOPIC SURGERY: POST OP INSTRUCTIONS  Always review your discharge instruction sheet given to you by the facility where your surgery was performed. IF YOU HAVE DISABILITY OR FAMILY LEAVE FORMS, YOU MUST BRING THEM TO THE OFFICE FOR PROCESSING.   DO NOT GIVE THEM TO YOUR DOCTOR.  1. A prescription for pain medication may be given to you upon discharge.  Take your pain medication as prescribed, if needed.  If narcotic pain medicine is not needed, then you may take acetaminophen (Tylenol), naprosyn (Alleve), or ibuprofen (Advil) as needed. 2. Take your usually prescribed medications unless otherwise directed. 3. If you need a refill on your pain medication, please contact your pharmacy.  They will contact our office to request authorization. Prescriptions will not be filled after 5pm or on week-ends. 4. You should follow a light diet the first few days after arrival home, such as soup and crackers, etc.  Be sure to include lots of fluids daily. 5. Most patients will experience some swelling and bruising in the area of the incisions.  Ice packs will help.  Swelling and bruising can take several days to resolve.  6. It is common to experience some constipation if taking pain medication after surgery.  Increasing fluid intake and taking a stool softener (such as Colace) will usually help or prevent this problem from occurring.  A mild laxative (Milk of Magnesia or Miralax) should be taken according to package instructions if there are no bowel movements after 48 hours. 7. Unless discharge instructions indicate otherwise, you may remove your bandages 48 hours after surgery, and you may shower at that time.  You may have steri-strips (small skin tapes) in place directly over the incision.  These strips should be left on the skin for 7-10 days.  If your surgeon used skin glue on the incision, you may shower in 24 hours.  The glue will flake  off over the next 2-3 weeks.  Any sutures or staples will be removed at the office during your follow-up visit. 8. ACTIVITIES:  You may resume regular (light) daily activities beginning the next day--such as daily self-care, walking, climbing stairs--gradually increasing activities as tolerated.  You may have sexual intercourse when it is comfortable.  Refrain from any heavy lifting or straining until approved by your doctor. a. You may drive when you are no longer taking prescription pain medication, you can comfortably wear a seatbelt, and you can safely maneuver your car and apply brakes. b. RETURN TO WORK:  __________________________________________________________ 9. You should see your doctor in the office for a follow-up appointment approximately 2-3 weeks after your surgery.  Make sure that you call for this appointment within a day or two after you arrive home to insure a convenient appointment time. 10. OTHER INSTRUCTIONS: __________________________________________________________________________________________________________________________ __________________________________________________________________________________________________________________________ WHEN TO CALL YOUR DOCTOR: 1. Fever over 101.0 2. Inability to urinate 3. Continued bleeding from incision. 4. Increased pain, redness, or drainage from the incision. 5. Increasing abdominal pain  The clinic staff is available to answer your questions during regular business hours.  Please don't hesitate to call and ask to speak to one of the nurses for clinical concerns.  If you have a medical emergency, go to the nearest emergency room or call 911.  A surgeon from [facility] is always on call at the hospital. [address], [address], [place], [place]  [address] ? [address] [address], [place], [place]   [address] [phone] ? [phone] ? [phone] Web site: [email or web address]  

## 2011-04-04 NOTE — Progress Notes (Signed)
Patient ID: Larry Hampton, male   DOB: 04-15-1966, 45 y.o.   MRN: [id number]  Chief Complaint  Patient presents with  . New Evaluation    eval of abdominal pain possible GB     HPI Bergen L Zingale is a 45 y.o. male.  Referred by Dr. Catha Gosselin and Dr. Dulce Sellar HPI This is a 45 year old male who presents after having several months of stomach discomfort after eating for which he lost 20 pounds. He describes his is some epigastric discomfort as well as chest pain here he he primarily complained about the chest pain. He was given some ibuprofen for this which cause him some issues with heartburn. He also apparently had some hematemesis and had an EGD apparently that was normal at that time by Dr. Dulce Sellar. He has undergone an evaluation with a CT scan and an ultrasound and a HIDA scan. It does show a hepatic hemangioma as well as an abnormal gallbladder ejection fraction. He does report reproduction of his pain during the HIDA scan. He comes in today to discuss a cholecystectomy. He denies any nausea, vomiting, and fever. Nothing is done is made this better. It is aggravated by food.  Past Medical History  Diagnosis Date  . Pleurisy 02/22/2011    History reviewed. No pertinent past surgical history.  History reviewed. No pertinent family history.  Social History History  Substance Use Topics  . Smoking status: Never Smoker   . Smokeless tobacco: Never Used  . Alcohol Use: No    Allergies  Allergen Reactions  . Nitrofuran Derivatives     Current Outpatient Prescriptions  Medication Sig Dispense Refill  . HYDROcodone-acetaminophen (NORCO) 5-325 MG per tablet Take 1 tablet by mouth every 6 (six) hours as needed.          Review of Systems Review of Systems  Constitutional: Negative for fever, chills and unexpected weight change.  HENT: Negative for hearing loss, congestion, sore throat, trouble swallowing and voice change.   Eyes: Negative for visual disturbance.  Respiratory:  Negative for cough and wheezing.   Cardiovascular: Positive for chest pain. Negative for palpitations and leg swelling.  Gastrointestinal: Positive for nausea, abdominal pain and constipation. Negative for vomiting, diarrhea, blood in stool, abdominal distention, anal bleeding and rectal pain.  Genitourinary: Negative for hematuria and difficulty urinating.  Musculoskeletal: Negative for arthralgias.  Skin: Negative for rash and wound.  Neurological: Positive for weakness and headaches. Negative for seizures and syncope.  Hematological: Negative for adenopathy. Does not bruise/bleed easily.  Psychiatric/Behavioral: Negative for confusion.    Blood pressure 132/84, pulse 64, temperature 97.3 F (36.3 C), temperature source Temporal, resp. rate 18, height 5' 5.5" (1.664 m), weight 129 lb 8 oz (58.741 kg).  Physical Exam Physical Exam  Constitutional: He appears well-developed and well-nourished.  Eyes: No scleral icterus.  Neck: Neck supple.  Cardiovascular: Normal rate, regular rhythm and normal heart sounds.   Pulmonary/Chest: Effort normal and breath sounds normal. He has no wheezes. He has no rales.  Abdominal: Soft. He exhibits no mass. There is tenderness (epigastric tenderness). There is negative Murphy's sign.  Lymphadenopathy:    He has no cervical adenopathy.    Data Reviewed NUCLEAR MEDICINE HEPATOBILIARY WITH GB, PHARM AND QUAN MEASURE  Radiopharmaceutical: 5.5 mCi technetium 99 Choletec. 1.2 micro  grams of CCK.  Comparison: 03/08/2011  Findings: There is satisfactory uptake of radiopharmaceutical from  the blood pool.  Biliary activity is visible at 10 minutes and gallbladder activity  is visible at  40 minutes. Bowel activity is visible at 15 minutes.  The patient reported no pain with CCK infusion. Gallbladder  ejection fraction at 29 minutes was 15% (normal is greater than  30%).  IMPRESSION:  1. Gallbladder dysfunction, with 30-minute gallbladder ejection    fraction of 15% (normal is greater than 30%). Otherwise negative  exam.   Assessment    Biliary dyskinesia    Plan    I had a long talk with him today about his abdominal pain. I'm not entirely sure this is coming from his gallbladder but his only positive tests and evaluation of shown this to be his gallbladder. We had a long discussion today about a cholecystectomy. I told him specifically if there is a chance this may not cure any other symptoms or may cure some of them to some degree. He voiced his understanding to this. He very much would like to proceed with consideration of surgery right now.    I discussed the procedure in detail.  The patient was given Agricultural engineer.  We discussed the risks and benefits of a laparoscopic cholecystectomy and possible cholangiogram including, but not limited to bleeding, infection, injury to surrounding structures such as the intestine or liver, bile leak, retained gallstones, need to convert to an open procedure, prolonged diarrhea, blood clots such as  DVT, common bile duct injury, anesthesia risks, and possible need for additional procedures.  The likelihood of improvement in symptoms and return to the patient's normal status is good. We discussed the typical post-operative recovery course.     Thelma Viana 04/04/2011, 4:55 PM

## 2011-04-26 ENCOUNTER — Encounter (INDEPENDENT_AMBULATORY_CARE_PROVIDER_SITE_OTHER): Payer: Self-pay | Admitting: Family Medicine

## 2012-04-19 IMAGING — US US ABDOMEN COMPLETE
1 series · 13 of 25 positions shown · non-contrast
Comparison: CT angio chest of 02/14/2011

CLINICAL DATA: Abdominal pain, vomiting blood

COMPLETE ABDOMINAL ULTRASOUND

[Series 1: us abdomen complete · 0.22mm/px · 13 of 94 slices shown]
[im 1/94]
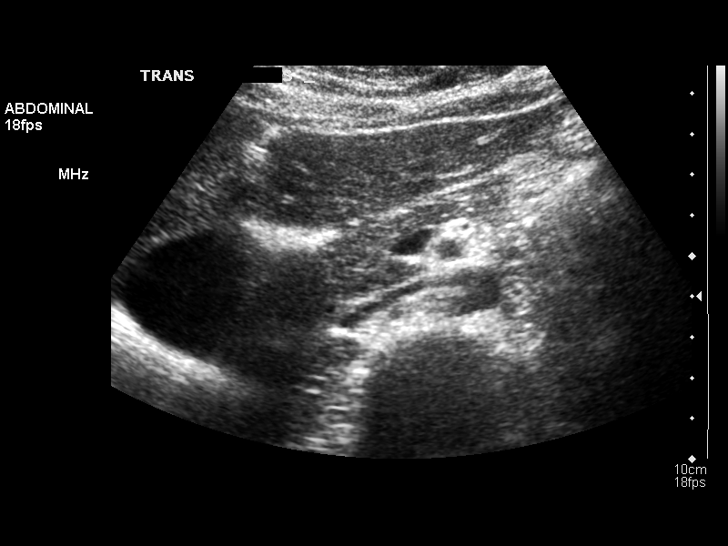
[im 8/94]
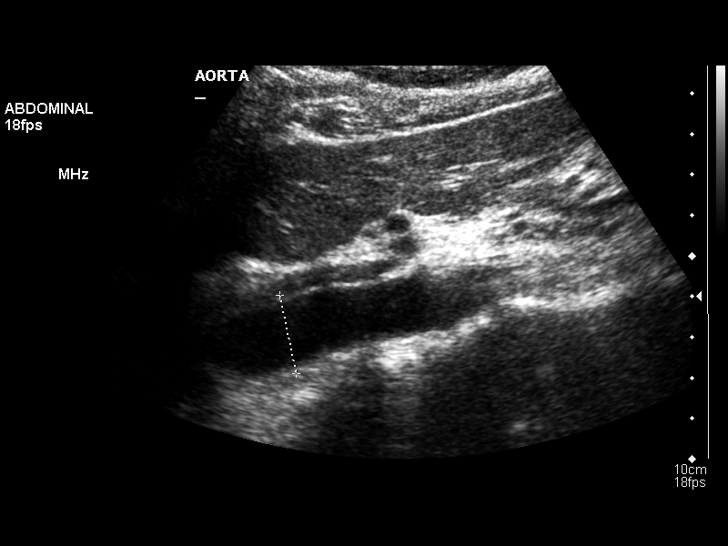
[im 16/94]
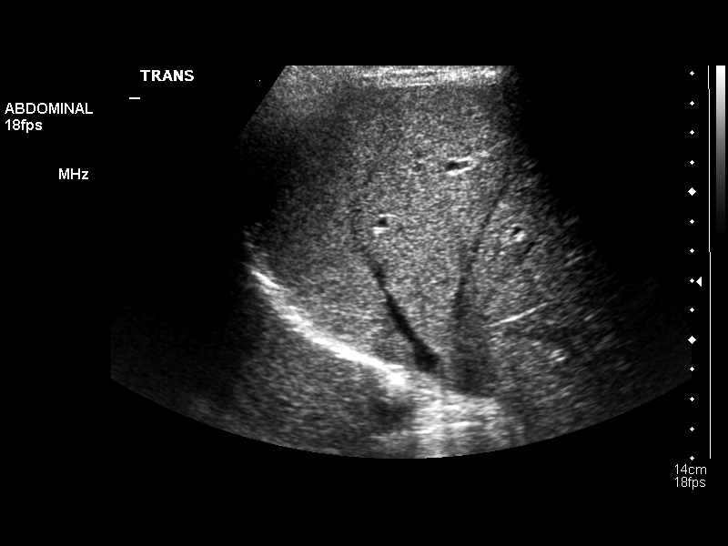
[im 24/94]
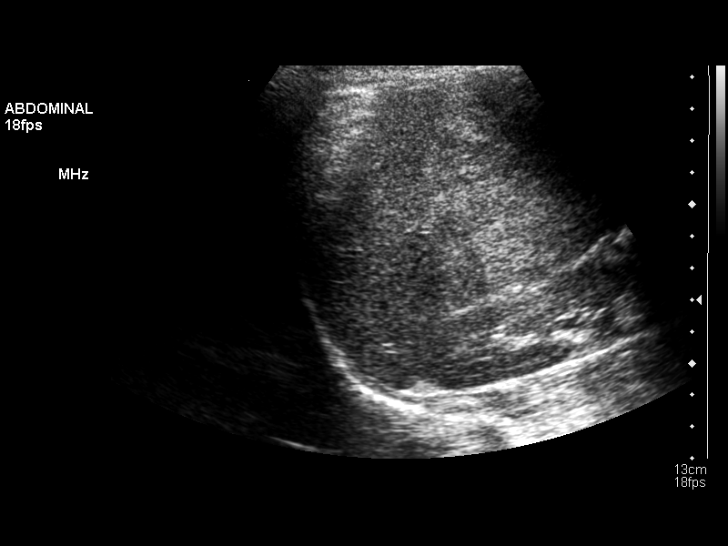
[im 32/94]
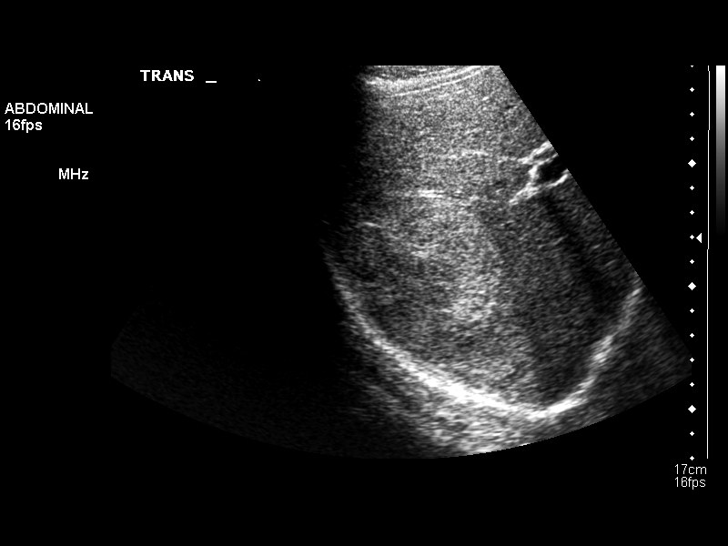
[im 39/94]
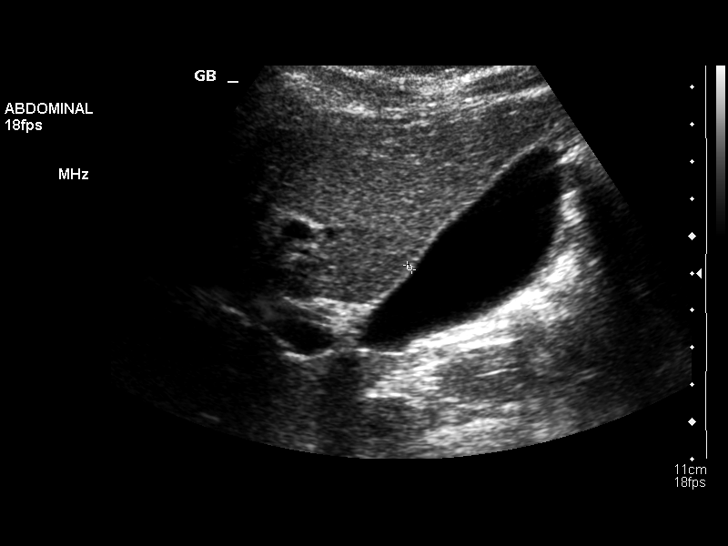
[im 47/94]
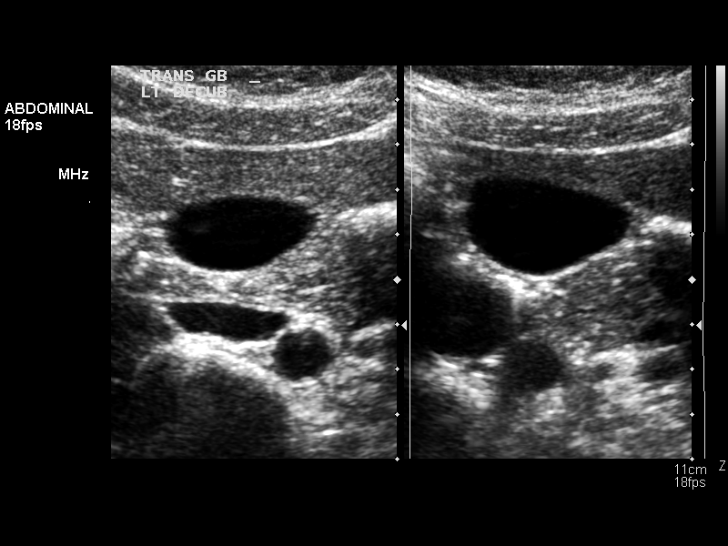
[im 55/94]
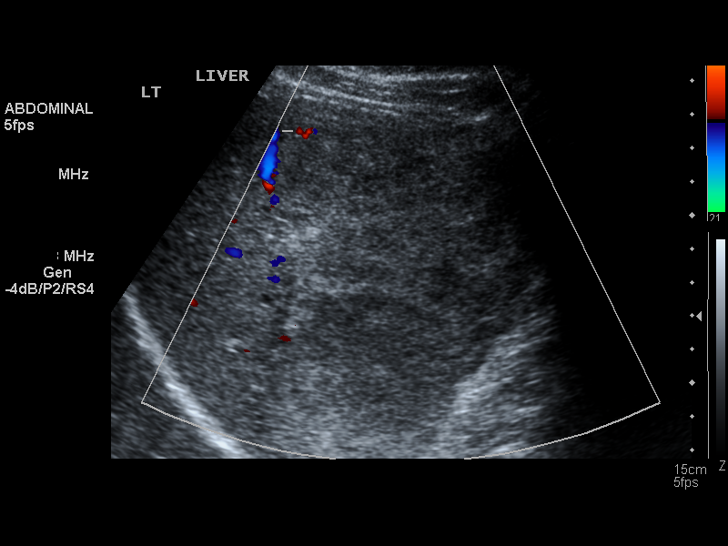
[im 63/94]
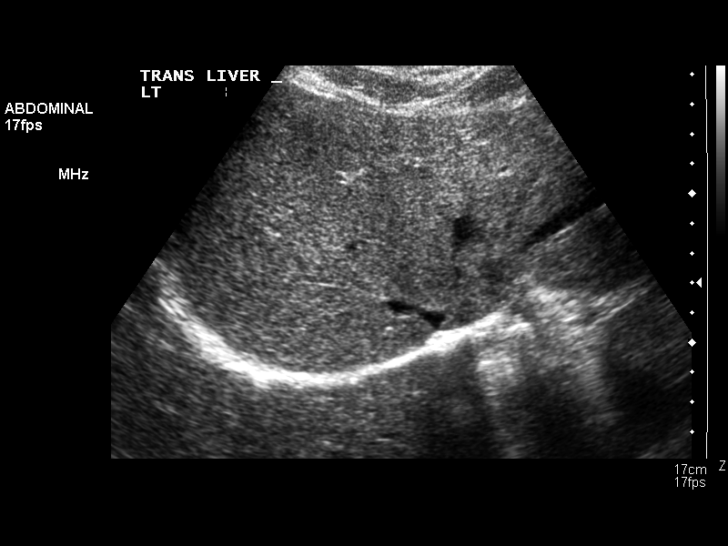
[im 70/94]
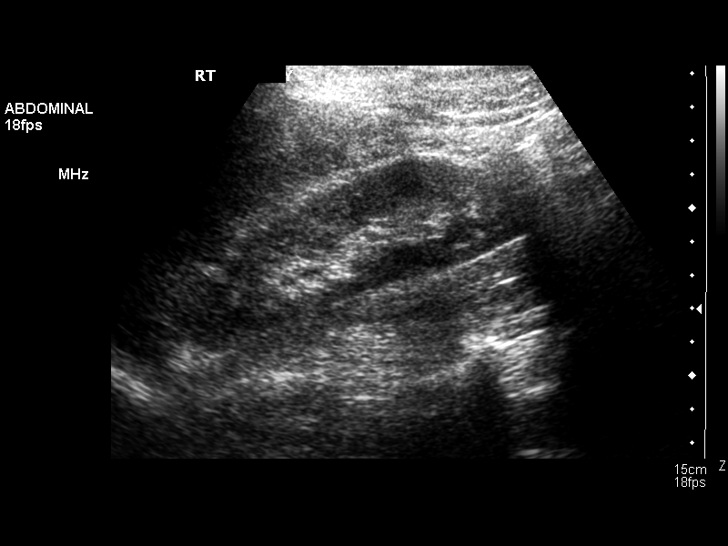
[im 78/94]
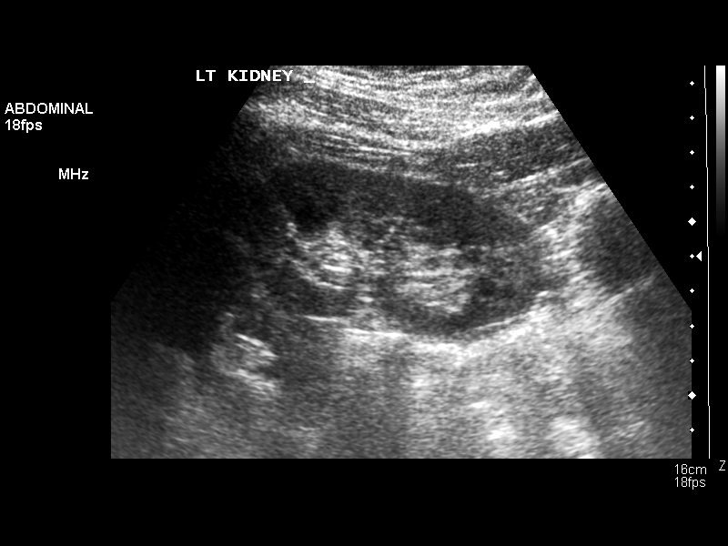
[im 86/94]
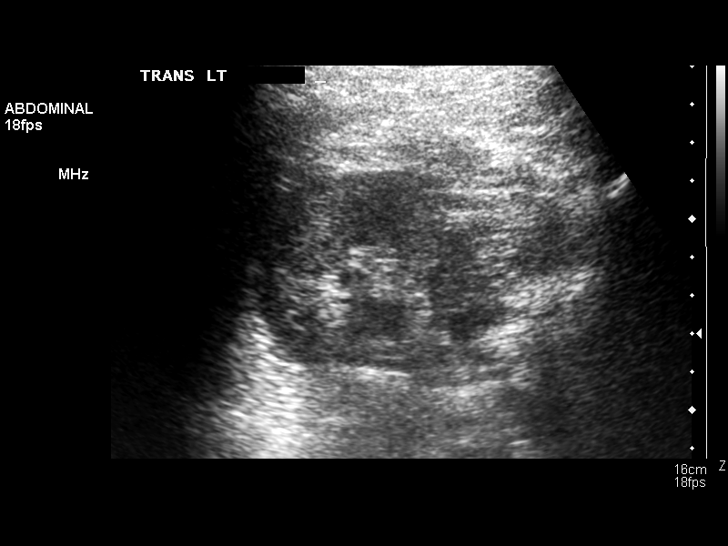
[im 94/94]
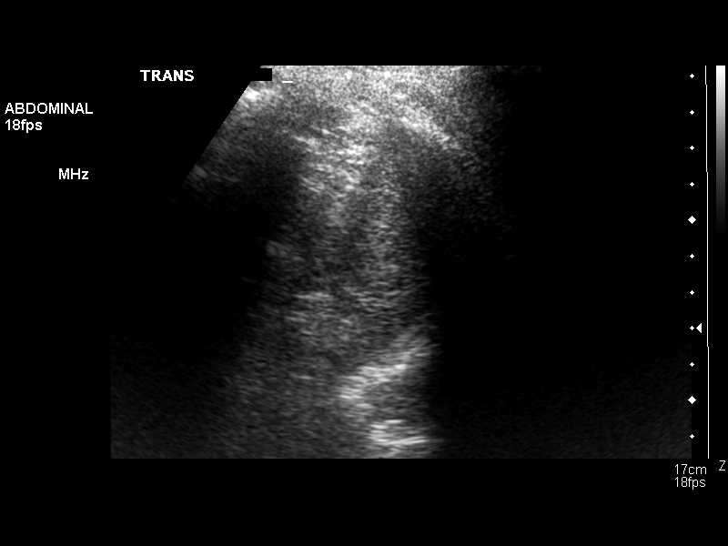

[13 of 25 positions shown; findings below may reference images not displayed]

FINDINGS: Gallbladder:  The gallbladder is visualized and no gallstones are
noted.  There is no pain over the gallbladder with compression.

Common bile duct:  The common bile duct is normal measuring 3.1 mm
in diameter.

Liver:  A lobular mixed echogenic lesion is present within the
right lobe of liver measuring 11.9 x 7.6 x 8.4 cm.  However
compared to the prior CT of the chest, this demonstrated peripheral
nodular enhancement and was considered to be most consistent with
hemangioma. No other hepatic abnormality is seen.  No ductal
dilatation is noted.

IVC:  Appears normal.

Pancreas:  No focal abnormality seen.

Spleen:  The spleen is difficult to visualize due to a large amount
of bowel gas in the left upper quadrant.  On recent CT, no
abnormality of the spleen was evident.

Right Kidney:  No hydronephrosis is seen.  The right kidney
measures 11.7 cm sagittally.

Left Kidney:  No hydronephrosis.  The left kidney measures 10.3 cm.

Abdominal aorta:  The abdominal aorta is normal in caliber.

The primary area of tenderness on this abdominal ultrasound exam is
within the left upper quadrant.
IMPRESSION: 1.  No change in probable hepatic hemangioma in the right lobe.  No
other abnormality.
2.  No gallstones.
3.  The spleen is obscured by bowel gas.

## 2014-06-03 ENCOUNTER — Emergency Department (HOSPITAL_COMMUNITY): Payer: Self-pay

## 2014-06-03 ENCOUNTER — Emergency Department (HOSPITAL_COMMUNITY)
Admission: EM | Admit: 2014-06-03 | Discharge: 2014-06-03 | Disposition: A | Discharge status: Home / Self Care | Payer: Self-pay | Attending: Emergency Medicine | Admitting: Emergency Medicine

## 2014-06-03 ENCOUNTER — Encounter (HOSPITAL_COMMUNITY): Payer: Self-pay | Admitting: Emergency Medicine

## 2014-06-03 DIAGNOSIS — M79662 Pain in left lower leg: Secondary | ICD-10-CM | POA: Insufficient documentation

## 2014-06-03 DIAGNOSIS — Z86718 Personal history of other venous thrombosis and embolism: Secondary | ICD-10-CM | POA: Insufficient documentation

## 2014-06-03 DIAGNOSIS — R000 Tachycardia, unspecified: Secondary | ICD-10-CM | POA: Insufficient documentation

## 2014-06-03 DIAGNOSIS — R079 Chest pain, unspecified: Secondary | ICD-10-CM | POA: Insufficient documentation

## 2014-06-03 DIAGNOSIS — Z8709 Personal history of other diseases of the respiratory system: Secondary | ICD-10-CM | POA: Insufficient documentation

## 2014-06-03 HISTORY — DX: Acute embolism and thrombosis of unspecified deep veins of unspecified lower extremity: I82.409

## 2014-06-03 LAB — CBC WITH DIFFERENTIAL/PLATELET
BASOS ABS: 0 10*3/uL (ref 0.0–0.1)
Basophils Relative: 1 % (ref 0–1)
EOS ABS: 0.2 10*3/uL (ref 0.0–0.7)
Eosinophils Relative: 2 % (ref 0–5)
HCT: 46.3 % (ref 39.0–52.0)
Hemoglobin: 15.5 g/dL (ref 13.0–17.0)
Lymphocytes Relative: 30 % (ref 12–46)
Lymphs Abs: 2.2 10*3/uL (ref 0.7–4.0)
MCH: 29.1 pg (ref 26.0–34.0)
MCHC: 33.5 g/dL (ref 30.0–36.0)
MCV: 87 fL (ref 78.0–100.0)
MONOS PCT: 12 % (ref 3–12)
Monocytes Absolute: 0.9 10*3/uL (ref 0.1–1.0)
Neutro Abs: 3.9 10*3/uL (ref 1.7–7.7)
Neutrophils Relative %: 55 % (ref 43–77)
PLATELETS: 290 10*3/uL (ref 150–400)
RBC: 5.32 MIL/uL (ref 4.22–5.81)
RDW: 13.1 % (ref 11.5–15.5)
WBC: 7.2 10*3/uL (ref 4.0–10.5)

## 2014-06-03 LAB — BASIC METABOLIC PANEL
Anion gap: 7 (ref 5–15)
BUN: 11 mg/dL (ref 6–23)
CO2: 30 mmol/L (ref 19–32)
CREATININE: 1.33 mg/dL (ref 0.50–1.35)
Calcium: 9.4 mg/dL (ref 8.4–10.5)
Chloride: 104 mmol/L (ref 96–112)
GFR calc non Af Amer: 61 mL/min — ABNORMAL LOW (ref >90)
GFR, EST AFRICAN AMERICAN: 71 mL/min — AB (ref >90)
Glucose, Bld: 90 mg/dL (ref 70–99)
Potassium: 4 mmol/L (ref 3.5–5.1)
Sodium: 141 mmol/L (ref 135–145)

## 2014-06-03 LAB — TROPONIN I

## 2014-06-03 LAB — PROTIME-INR
INR: 0.96 (ref 0.00–1.49)
PROTHROMBIN TIME: 12.9 s (ref 11.6–15.2)

## 2014-06-03 LAB — APTT: aPTT: 28 seconds (ref 24–37)

## 2014-06-03 MED ORDER — ENOXAPARIN SODIUM 150 MG/ML ~~LOC~~ SOLN
1.0000 mg/kg | Freq: Once | SUBCUTANEOUS | Status: AC
  Administered 2014-06-03: 60 mg via SUBCUTANEOUS
  Filled 2014-06-03: qty 1

## 2014-06-03 MED ORDER — SODIUM CHLORIDE 0.9 % IV SOLN
INTRAVENOUS | Status: DC
  Administered 2014-06-03: 20 mL/h via INTRAVENOUS

## 2014-06-03 MED ORDER — IOHEXOL 350 MG/ML SOLN
100.0000 mL | Freq: Once | INTRAVENOUS | Status: AC | PRN
  Administered 2014-06-03: 100 mL via INTRAVENOUS

## 2014-06-03 NOTE — ED Provider Notes (Addendum)
CSN: [id number]     Arrival date & time 06/03/14  1421 History   First MD Initiated Contact with Patient 06/03/14 1528     Chief Complaint  Patient presents with  . Shortness of Breath  . Leg Pain     (Consider location/radiation/quality/duration/timing/severity/associated sxs/prior Treatment) HPI Comments: Patient here complaining of weakness and shortness of breath which began yesterday. He also has been noting bilateral lower extremity pain worse on the left calf that has been continuing today. History of DVT in the past and this will similar. Denies any anginal type chest pain currently. Denies any dyspnea on exertion. Patient states that he just feels weak all over. No recent travel history. His DVT was 6 years ago from an unknown cause. Denies any syncope or near-syncope. Symptoms persistent and nothing makes them better or worse. No treatment use prior to arrival  Patient is a 49 y.o. male presenting with shortness of breath and leg pain. The history is provided by the patient.  Shortness of Breath Leg Pain   Past Medical History  Diagnosis Date  . Pleurisy 02/22/2011  . DVT of lower extremity (deep venous thrombosis)    History reviewed. No pertinent past surgical history. No family history on file. History  Substance Use Topics  . Smoking status: Never Smoker   . Smokeless tobacco: Never Used  . Alcohol Use: No    Review of Systems  Respiratory: Positive for shortness of breath.   All other systems reviewed and are negative.     Allergies  Nitrofuran derivatives  Home Medications   Prior to Admission medications   Medication Sig Start Date End Date Taking? Authorizing Provider  HYDROcodone-acetaminophen (NORCO) 5-325 MG per tablet Take 1 tablet by mouth every 6 (six) hours as needed.      Historical Provider, MD   BP 135/79 mmHg  Pulse 126  Temp(Src) 98.4 F (36.9 C) (Oral)  Resp 18  SpO2 97% Physical Exam  Constitutional: He is oriented to person,  place, and time. He appears well-developed and well-nourished.  Non-toxic appearance. No distress.  HENT:  Head: Normocephalic and atraumatic.  Eyes: Conjunctivae, EOM and lids are normal. Pupils are equal, round, and reactive to light.  Neck: Normal range of motion. Neck supple. No tracheal deviation present. No thyroid mass present.  Cardiovascular: Regular rhythm and normal heart sounds.  Tachycardia present.  Exam reveals no gallop.   No murmur heard. Pulmonary/Chest: Effort normal and breath sounds normal. No stridor. No respiratory distress. He has no decreased breath sounds. He has no wheezes. He has no rhonchi. He has no rales.  Abdominal: Soft. Normal appearance and bowel sounds are normal. He exhibits no distension. There is no tenderness. There is no rebound and no CVA tenderness.  Musculoskeletal: Normal range of motion. He exhibits no edema or tenderness.       Legs: Neurological: He is alert and oriented to person, place, and time. He has normal strength. No cranial nerve deficit or sensory deficit. GCS eye subscore is 4. GCS verbal subscore is 5. GCS motor subscore is 6.  Skin: Skin is warm and dry. No abrasion and no rash noted.  Psychiatric: He has a normal mood and affect. His speech is normal and behavior is normal.  Nursing note and vitals reviewed.   ED Course  Procedures (including critical care time) Labs Review Labs Reviewed  CBC WITH DIFFERENTIAL/PLATELET  BASIC METABOLIC PANEL  TROPONIN I  PROTIME-INR  APTT    Imaging Review No results  found.   EKG Interpretation   Date/Time:  Friday June 03 2014 20:01:24 EST Ventricular Rate:  92 PR Interval:  128 QRS Duration: 80 QT Interval:  359 QTC Calculation: 444 R Axis:   75 Text Interpretation:  Sinus rhythm RSR' in V1 or V2, right VCD or RVH  Confirmed by Kaiulani Sitton  MD, Eisa Conaway ([id number]) on 06/03/2014 8:17:48 PM      MDM   Final diagnoses:  Chest pain    Patient's heart rate has improved here. Chest  CT negative for pulmonary embolism. Does have tenderness in his left calf and will arrange for patient to have outpatient Doppler of his left calf tomorrow. He will be treated with Lovenox in the meantime    Toy BakerAnthony T Twala Collings, MD 06/03/14 1956  Toy BakerAnthony T Gabriela Giannelli, MD 06/03/14 2018

## 2014-06-03 NOTE — ED Notes (Signed)
MD at bedside. 
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
## 2014-06-03 NOTE — ED Notes (Signed)
Patient transported to CT 

## 2014-06-03 NOTE — Discharge Instructions (Signed)
Follow-up tomorrow to have the ultrasound of your left calf. Return here at once for any trouble breathing, chest pain or any other problems

## 2014-06-04 ENCOUNTER — Ambulatory Visit (HOSPITAL_COMMUNITY): Payer: Self-pay | Attending: Emergency Medicine
